# Patient Record
Sex: Female | Born: 1960 | Race: Black or African American | Hispanic: No | Marital: Single | Smoking: Never smoker
Health system: Southern US, Community
[De-identification: ages and names within clinical notes are randomized; demographics above are authoritative.]

## PROBLEM LIST (undated history)

## (undated) DIAGNOSIS — I10 Essential (primary) hypertension: Secondary | ICD-10-CM

## (undated) HISTORY — PX: ABDOMINAL SURGERY: SHX537

---

## 2014-12-31 ENCOUNTER — Emergency Department (HOSPITAL_COMMUNITY): Payer: Medicaid - Out of State

## 2014-12-31 ENCOUNTER — Emergency Department (HOSPITAL_COMMUNITY)
Admission: EM | Admit: 2014-12-31 | Discharge: 2014-12-31 | Disposition: A | Discharge status: Home / Self Care | Payer: Medicaid - Out of State | Attending: Emergency Medicine | Admitting: Emergency Medicine

## 2014-12-31 ENCOUNTER — Encounter (HOSPITAL_COMMUNITY): Payer: Self-pay | Admitting: *Deleted

## 2014-12-31 DIAGNOSIS — Z3202 Encounter for pregnancy test, result negative: Secondary | ICD-10-CM | POA: Diagnosis not present

## 2014-12-31 DIAGNOSIS — R109 Unspecified abdominal pain: Secondary | ICD-10-CM | POA: Diagnosis not present

## 2014-12-31 DIAGNOSIS — M545 Low back pain: Secondary | ICD-10-CM | POA: Diagnosis not present

## 2014-12-31 DIAGNOSIS — M549 Dorsalgia, unspecified: Secondary | ICD-10-CM

## 2014-12-31 DIAGNOSIS — R112 Nausea with vomiting, unspecified: Secondary | ICD-10-CM | POA: Diagnosis not present

## 2014-12-31 DIAGNOSIS — I10 Essential (primary) hypertension: Secondary | ICD-10-CM | POA: Insufficient documentation

## 2014-12-31 DIAGNOSIS — R079 Chest pain, unspecified: Secondary | ICD-10-CM | POA: Diagnosis present

## 2014-12-31 HISTORY — DX: Essential (primary) hypertension: I10

## 2014-12-31 LAB — URINALYSIS, ROUTINE W REFLEX MICROSCOPIC
Bilirubin Urine: NEGATIVE
Glucose, UA: NEGATIVE mg/dL
Hgb urine dipstick: NEGATIVE
Ketones, ur: NEGATIVE mg/dL
Leukocytes, UA: NEGATIVE
NITRITE: NEGATIVE
Protein, ur: NEGATIVE mg/dL
SPECIFIC GRAVITY, URINE: 1.014 (ref 1.005–1.030)
UROBILINOGEN UA: 0.2 mg/dL (ref 0.0–1.0)
pH: 7.5 (ref 5.0–8.0)

## 2014-12-31 LAB — BASIC METABOLIC PANEL
Anion gap: 4 — ABNORMAL LOW (ref 5–15)
BUN: 9 mg/dL (ref 6–20)
CO2: 27 mmol/L (ref 22–32)
Calcium: 10.6 mg/dL — ABNORMAL HIGH (ref 8.9–10.3)
Chloride: 107 mmol/L (ref 101–111)
Creatinine, Ser: 0.71 mg/dL (ref 0.44–1.00)
GFR calc Af Amer: >60 mL/min (ref >60)
GFR calc non Af Amer: >60 mL/min (ref >60)
Glucose, Bld: 107 mg/dL — ABNORMAL HIGH (ref 65–99)
Potassium: 3.7 mmol/L (ref 3.5–5.1)
Sodium: 138 mmol/L (ref 135–145)

## 2014-12-31 LAB — CBC
HCT: 37.9 % (ref 36.0–46.0)
Hemoglobin: 12.5 g/dL (ref 12.0–15.0)
MCH: 26.8 pg (ref 26.0–34.0)
MCHC: 33 g/dL (ref 30.0–36.0)
MCV: 81.2 fL (ref 78.0–100.0)
Platelets: 271 10*3/uL (ref 150–400)
RBC: 4.67 MIL/uL (ref 3.87–5.11)
RDW: 15 % (ref 11.5–15.5)
WBC: 6.6 10*3/uL (ref 4.0–10.5)

## 2014-12-31 LAB — I-STAT TROPONIN, ED: Troponin i, poc: 0 ng/mL (ref 0.00–0.08)

## 2014-12-31 LAB — POC URINE PREG, ED: PREG TEST UR: NEGATIVE

## 2014-12-31 MED ORDER — OXYCODONE HCL 5 MG PO TABS: 5.0000 mg | ORAL_TABLET | ORAL | Status: AC | PRN

## 2014-12-31 MED ORDER — GADOBENATE DIMEGLUMINE 529 MG/ML IV SOLN
20.0000 mL | Freq: Once | INTRAVENOUS | Status: AC | PRN
  Administered 2014-12-31: 20 mL via INTRAVENOUS

## 2014-12-31 MED ORDER — IOHEXOL 300 MG/ML  SOLN
25.0000 mL | Freq: Once | INTRAMUSCULAR | Status: AC | PRN
  Administered 2014-12-31: 25 mL via ORAL

## 2014-12-31 MED ORDER — IOHEXOL 300 MG/ML  SOLN
100.0000 mL | Freq: Once | INTRAMUSCULAR | Status: AC | PRN
  Administered 2014-12-31: 100 mL via INTRAVENOUS

## 2014-12-31 NOTE — ED Notes (Signed)
Pt ambulated to the bathroom with ease 

## 2014-12-31 NOTE — ED Notes (Signed)
MD at bedside. 

## 2014-12-31 NOTE — ED Notes (Signed)
Pt states that she woke from sleep with central chest pain that is intermittent. Pt reports dizziness and nausea.

## 2014-12-31 NOTE — ED Provider Notes (Signed)
CSN: [id number]     Arrival date & time 12/31/14  1356 History   First MD Initiated Contact with Patient 12/31/14 1612     Chief Complaint  Patient presents with  . Chest Pain   Angela Foley is a 54 y.o. female with a past medical history significant for hypertension, congenital blindness, prior uterine surgery, and a reported history of hypercalcemia who presents with right flank pain, nausea, vomiting, abdominal pain, and chest pain. The patient reports that she was feeling normal yesterday however, this morning she began having right flank and right low back pain. The patient describes the pain radiating around towards her abdomen. The patient says she's had approximately 45 episodes of nonbloody nonbilious emesis. The patient also described associated light chest pressure. The patient described pain nonradiating, a 2 out of 10 in severity, and constant. The patient also felt it felt "like gas pain or aching from vomiting". The patient says she's never felt this pain before. The patient also describes 1 week of discolored cloudy urine. The patient denied a foul smell, dysuria, frequency or hesitancy. The patient denies a personal history of kidney stone however the patient's mother reports a family history of cholelithiasis. The patient denies any associated diaphoresis, fevers, chills, constipation, diarrhea and the patient had a normal bowel movement earlier today.  (Consider location/radiation/quality/duration/timing/severity/associated sxs/prior Treatment) Patient is a 54 y.o. female presenting with flank pain. The history is provided by the patient and a parent. No language interpreter was used.  Flank Pain This is a new problem. The current episode started today. The problem occurs constantly. The problem has been unchanged. Associated symptoms include abdominal pain, chest pain, nausea, urinary symptoms (cloudy urine, no pain) and vomiting. Pertinent negatives include no chills, congestion,  coughing, diaphoresis, fatigue, fever, neck pain, numbness or rash. Nothing aggravates the symptoms. She has tried nothing for the symptoms. The treatment provided no relief.    Past Medical History  Diagnosis Date  . Hypertension    Past Surgical History  Procedure Laterality Date  . Abdominal surgery     No family history on file. Social History  Substance Use Topics  . Smoking status: Never Smoker   . Smokeless tobacco: None  . Alcohol Use: None   OB History    No data available     Review of Systems  Constitutional: Negative for fever, chills, diaphoresis, appetite change and fatigue.  HENT: Negative for congestion.   Eyes: Negative for visual disturbance.  Respiratory: Negative for cough, choking, chest tightness, shortness of breath, wheezing and stridor.   Cardiovascular: Positive for chest pain. Negative for palpitations and leg swelling.  Gastrointestinal: Positive for nausea, vomiting and abdominal pain. Negative for diarrhea, constipation and blood in stool.  Genitourinary: Positive for flank pain.  Musculoskeletal: Positive for back pain. Negative for neck pain and neck stiffness.  Skin: Negative for rash and wound.  Neurological: Negative for numbness.  Psychiatric/Behavioral: Negative for confusion and agitation.  All other systems reviewed and are negative.     Allergies  Review of patient's allergies indicates no known allergies.  Home Medications   Prior to Admission medications   Not on File   BP 144/104 mmHg  Pulse 75  Temp(Src) 98.6 F (37 C) (Oral)  Resp 18  SpO2 99% Physical Exam  Constitutional: She is oriented to person, place, and time. She appears well-developed and well-nourished. No distress.  HENT:  Head: Normocephalic.  Mouth/Throat: Oropharynx is clear and moist. No oropharyngeal exudate.  Eyes: Conjunctivae  are normal. Pupils are equal, round, and reactive to light. Right eye exhibits nystagmus. Left eye exhibits nystagmus.   Neck: Normal range of motion.  Cardiovascular: Normal rate, regular rhythm, normal heart sounds and intact distal pulses.   No murmur heard. Pulmonary/Chest: Effort normal and breath sounds normal. No stridor. No respiratory distress. She has no wheezes. She exhibits no tenderness.  Abdominal: Soft. Bowel sounds are normal. She exhibits no distension. There is no tenderness. There is no rigidity, no rebound, no guarding and no CVA tenderness.  Musculoskeletal: She exhibits no tenderness.  Neurological: She is alert and oriented to person, place, and time. She exhibits normal muscle tone.  Skin: Skin is warm. She is not diaphoretic. No pallor.  Psychiatric: She has a normal mood and affect.  Nursing note and vitals reviewed.   ED Course  Procedures (including critical care time) Labs Review Labs Reviewed  BASIC METABOLIC PANEL - Abnormal; Notable for the following:    Glucose, Bld 107 (*)    Calcium 10.6 (*)    Anion gap 4 (*)    All other components within normal limits  URINE CULTURE  CBC  URINALYSIS, ROUTINE W REFLEX MICROSCOPIC (NOT AT [facility])  Rosezena Sensor, ED  POC URINE PREG, ED    Imaging Review Dg Chest 2 View  12/31/2014   CLINICAL DATA:  Chest pain beginning today.  EXAM: CHEST  2 VIEW  COMPARISON:  None.  FINDINGS: Cardiac enlargement. No infiltrates or failure. No effusion or pneumothorax. Bones unremarkable.  IMPRESSION: Cardiomegaly.  No active cardiopulmonary disease.   Electronically Signed   By: Elsie Stain M.D.   On: 12/31/2014 15:54   Mr Lumbar Spine W Wo Contrast  12/31/2014   CLINICAL DATA:  Non radiating back pain beginning yesterday morning. Intermittent central chest pain. Dizziness and nausea. History of hypertension.  EXAM: MRI LUMBAR SPINE WITHOUT AND WITH CONTRAST  TECHNIQUE: Multiplanar and multiecho pulse sequences of the lumbar spine were obtained without and with intravenous contrast.  CONTRAST:  20mL MULTIHANCE GADOBENATE DIMEGLUMINE 529 MG/ML IV  SOLN  COMPARISON:  CT abdomen and pelvis October 30, 2014 at 1854 hours  FINDINGS: Using the reference level of the last well-formed intervertebral disc as L5-S1, bright T1 and bright T2, intermediate to low STIR 15 mm polka-dot lesion in vertebral body L2 corresponding to CT abnormality with faint enhancement. This is most compatible with a hemangioma. Additional subcentimeter hemangioma superior endplate of S1. No suspicious osseous enhancement. Vertebral bodies intact and aligned and maintenance of lumbar lordosis. Slightly decreased T2 signal within the lower lumbar disc consistent with mild desiccation. Mild subacute discogenic endplate changes L3-4 through L5-S1. No STIR signal abnormality to suggest acute osseous process.  Conus medullaris terminates at L1 and appears normal in morphology and signal characteristics. No abnormal cord, leptomeningeal or epidural enhancement. Included prevertebral and paraspinal soft tissues are nonsuspicious; multiple signal lesions in the uterus compatible with leiomyomas, partially imaged.  Level by level evaluation:  L1-2, L2-3: No significant disc bulge, canal stenosis or neural foraminal narrowing.  L3-4: Minimal annular bulging. Mild facet arthropathy and ligamentum flavum redundancy without canal stenosis or neural foraminal narrowing.  L4-5: Minimal annular bulging, mild facet arthropathy and ligamentum flavum redundancy without canal stenosis. Minimal neural foraminal narrowing.  L5-S1: Small broad-based disc bulge, central enhancing annular fissure. Moderate bilateral facet arthropathy. No canal stenosis though there is encroachment upon the traversing S1 nerves within the lateral recess. Mild to moderate LEFT greater than RIGHT neural foraminal narrowing.  IMPRESSION: L2 hemangioma.  No suspicious lesions in the lumbar spine.  No acute lumbar spine fracture or malalignment.  Degenerative change of the lumbar spine without canal stenosis. Mild to moderate L5-S1 neural  foraminal narrowing.  L5-S1 annular fissure.   Electronically Signed   By: Awilda Metro M.D.   On: 12/31/2014 22:09   Ct Abdomen Pelvis W Contrast  12/31/2014   CLINICAL DATA:  Right flank and abdominal pain for 1 day. Nausea and vomiting. History of fibroid surgery years ago.  EXAM: CT ABDOMEN AND PELVIS WITH CONTRAST  TECHNIQUE: Multidetector CT imaging of the abdomen and pelvis was performed using the standard protocol following bolus administration of intravenous contrast.  CONTRAST:  OMNIPAQUE IOHEXOL 300 MG/ML  SOLN  COMPARISON:  None.  FINDINGS: Lower chest: There are a few small thin-walled air cysts scattered at both lung bases, which are nonspecific. No significant pulmonary nodules or acute consolidative airspace disease at the lung bases.  Hepatobiliary: Normal liver with no liver mass. Normal gallbladder with no radiopaque cholelithiasis. No biliary ductal dilatation.  Pancreas: Normal, with no mass or duct dilation.  Spleen: Normal size. No mass.  Adrenals/Urinary Tract: Normal adrenals. Simple 1.0 cm renal cyst in the left upper kidney. Subcentimeter hypodense lesion in the lateral lower left kidney, too small to characterize. No hydronephrosis. Normal bladder.  Stomach/Bowel: Grossly normal stomach. Normal caliber small bowel with no small bowel wall thickening. Normal appendix. Normal large bowel with no diverticulosis, large bowel wall thickening or pericolonic fat stranding.  Vascular/Lymphatic: Mildly atherosclerotic nonaneurysmal abdominal aorta. Patent portal, splenic, hepatic and renal veins. No pathologically enlarged lymph nodes in the abdomen or pelvis.  Reproductive: The anteverted uterus is mildly enlarged with a few hypodense uterine masses measuring up to at 2.9 cm, at least 1 of which contains coarse calcifications, most in keeping with uterine fibroids. No adnexal mass.  Other: No pneumoperitoneum, ascites or focal fluid collection.  Musculoskeletal: There is a  nonspecific 2.0 cm sclerotic lesion in the right posterior L2 vertebral body. Moderate degenerative changes in the visualized thoracolumbar spine.  IMPRESSION: 1. No CT findings to explain the patient's acute right-sided symptoms. Normal appendix. No evidence of bowel obstruction or acute bowel inflammation. No hydronephrosis. 2. Solitary 2 cm L2 vertebral body sclerotic lesion, nonspecific, cannot exclude a sclerotic metastasis. Consider further evaluation with lumbar spine MRI with and without intravenous contrast. 3. Mildly enlarged uterus containing several round masses most suggestive of fibroids. Consider correlation with pelvic ultrasound if not recently performed. 4. Nonspecific small thin-walled cysts at both lung bases, likely not clinically significant. Rarely, this can represent lymphoid interstitial pneumonia (LIP), especially if the patient has a history of Sjogren disease.   Electronically Signed   By: Delbert Phenix M.D.   On: 12/31/2014 19:23   I have personally reviewed and evaluated these images and lab results as part of my medical decision-making.   EKG Interpretation   Date/Time:  Wednesday December 31 2014 14:03:53 EDT Ventricular Rate:  73 PR Interval:  162 QRS Duration: 74 QT Interval:  404 QTC Calculation: 445 R Axis:   -12 Text Interpretation:  Normal sinus rhythm Possible Anterior infarct , age  undetermined Abnormal ECG No old tracing to compare Confirmed by Juleen China   MD, STEPHEN [phone]) on 12/31/2014 4:17:01 PM      MDM   Angela Foley is a 54 y.o. female with a past medical history significant for hypertension, congenital blindness, prior uterine surgery, and a reported history of hypercalcemia  who presents with right flank pain, nausea, vomiting, abdominal pain, and chest pain. Based on the patient's complaint of flank/abdominal pain, given her history there is differential diagnosis list including nephrolithiasis, cholelithiasis, musculoskeletal pain, hypercalcemia  pain, gastritis, pyelonephritis, abdominal mass, and urinary tract infection. The patient's chest pain is felt likely related to her nausea and vomiting however the patient will also have a chest x-ray, EKG, and troponin to investigate. The patient will have laboratory testing, urine testing, and CT scan to determine the etiology of her symptoms. The patient's pain is mild on initial examination and the patient did not request any pain medicine or nausea medicine on arrival.   The patient's initial troponin was negative, her BMP did not show any acute abnormalities, and her CBC was unremarkable. The patient's urine did not show evidence of infection or findings that would suggest  stone.  7:57 PM The patient's CT scan returned showing concern for possible lumbar spine metastasis versus other bony abnormality. Given the patient's similar location of her back pain to this abnormality, and the recommendation by radiology for further imaging, the patient will have a lumbar spine MRI with and without contrast further evaluate.  The patient's MRI did not show evidence of metastatic disease and instead showed evidence of a hemangioma in her lumbar spine. The patient was informed of these findings and reassured.    The patient reported resolution of her presenting flank back and abdominal pain. Suspect a musculoskeletal etiology with a negative urinalysis for infection or blood, negative CT scan for nephrolithiasis or cholelithiasis. The patient reports that she is going to [place] this weekend and heading back to her home city of [place] next week where she will follow-up with her PCP. The patient was given several doses of a pain medication in order to alleviate the pain. The patient was given return precautions for any new or worsening symptoms including loss of bowel or bladder function, difficulty with strength sensation or coordination of her legs, and any other changes.  The patient and her family was  understanding of the plan of care, they had no further questions, concerns, or complaints and the patient was discharged in good condition.  This patient was seen with Dr. Juleen China, emergency medicine attending.   Final diagnoses:  Back pain      Theda Belfast, MD 01/01/15 1610  Raeford Razor, MD 01/08/15 1531

## 2015-01-01 LAB — URINE CULTURE

## 2016-11-23 IMAGING — MR MR LUMBAR SPINE WO/W CM
4 of 7 series · 18 of 48 positions shown · IV contrast (multihance)
Comparison: CT abdomen and pelvis October 30, 2014 at 7071 hours

CLINICAL DATA: Non radiating back pain beginning yesterday morning.
Intermittent central chest pain. Dizziness and nausea. History of
hypertension.

EXAM:
MRI LUMBAR SPINE WITHOUT AND WITH CONTRAST
TECHNIQUE: Multiplanar and multiecho pulse sequences of the lumbar spine were
obtained without and with intravenous contrast.
CONTRAST:  20mL MULTIHANCE GADOBENATE DIMEGLUMINE 529 MG/ML IV SOLN

[Series 200: T2 · sagittal · 4.0mm · 0.55mm/px · 5 of 13 slices shown (1 of 2)]
[im 1/13]
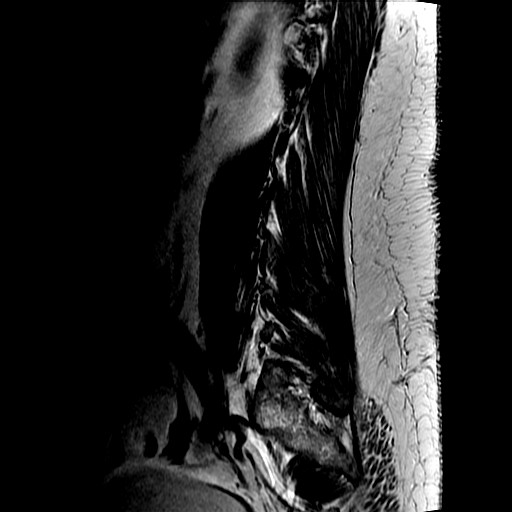
[im 4/13]
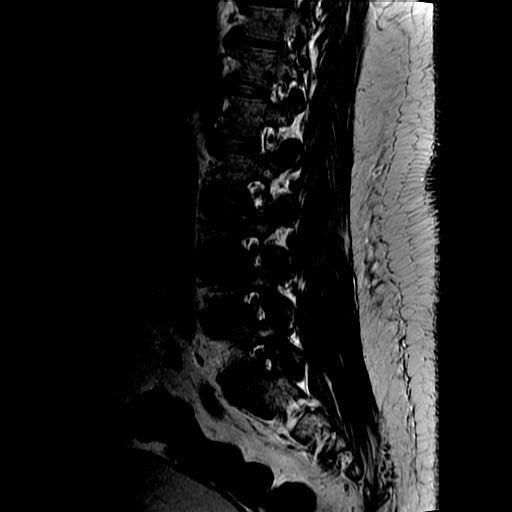
[im 7/13]
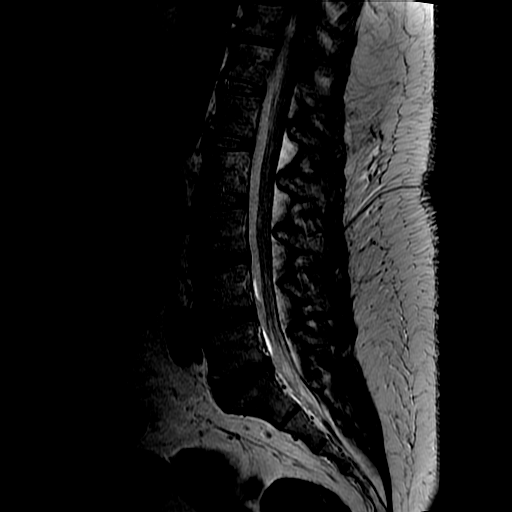
[im 10/13]
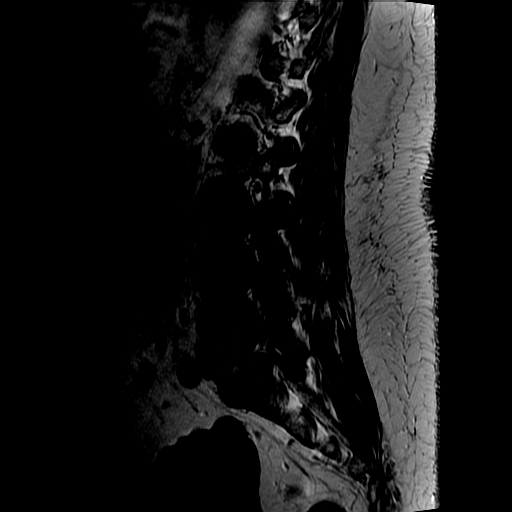
[im 13/13]
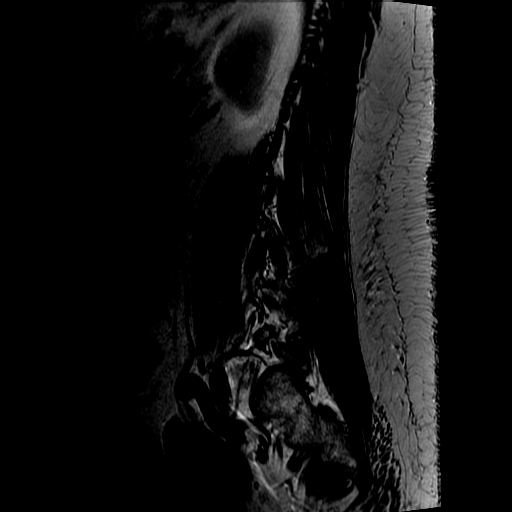

[Series 400: T1 · sagittal · 4.0mm · 0.55mm/px · 3 of 13 slices shown (1 of 2)]
[im 1/13]
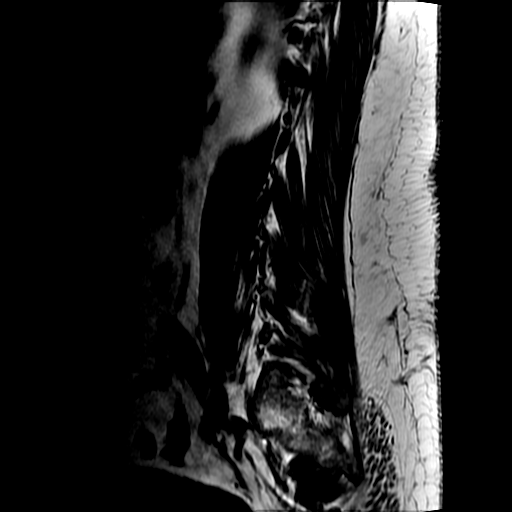
[im 9/13]
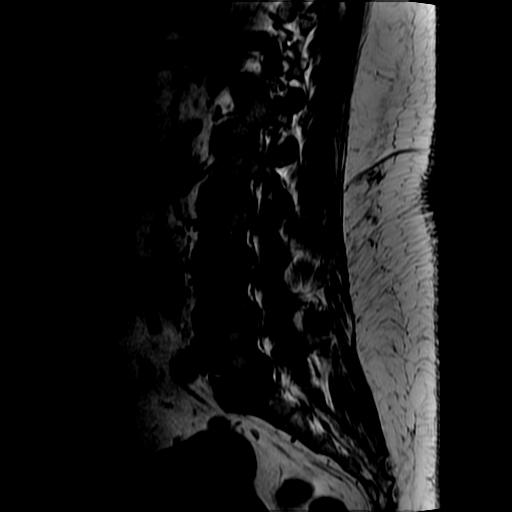
[im 13/13]
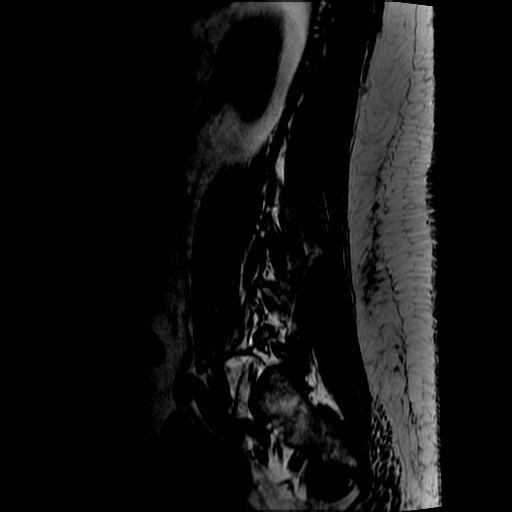

[Series 500: T2 · axial · 4.0mm · 0.39mm/px · z∈[-59,+110]mm · 7 of 33 slices shown (2 of 2)]
[im 1/33]
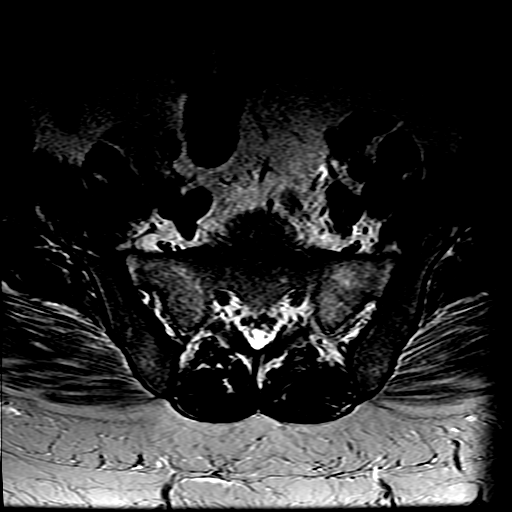
[im 4/33]
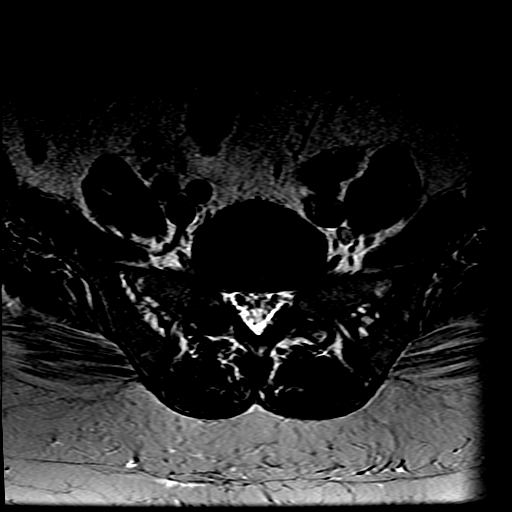
[im 11/33]
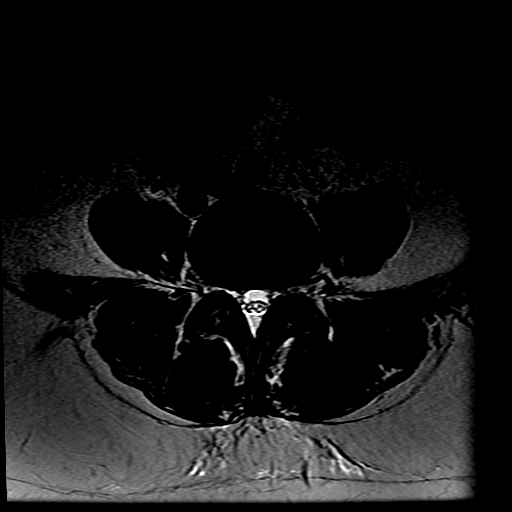
[im 15/33]
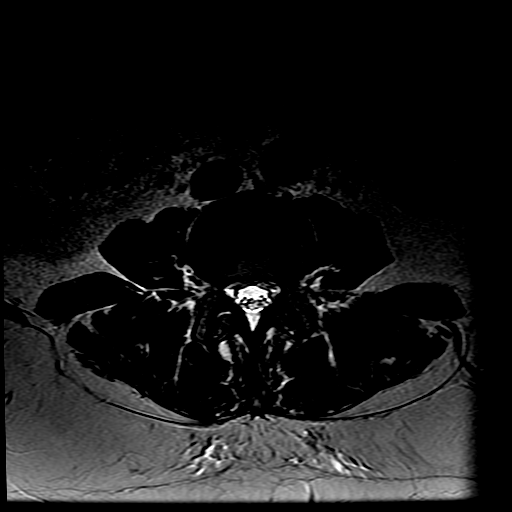
[im 18/33]
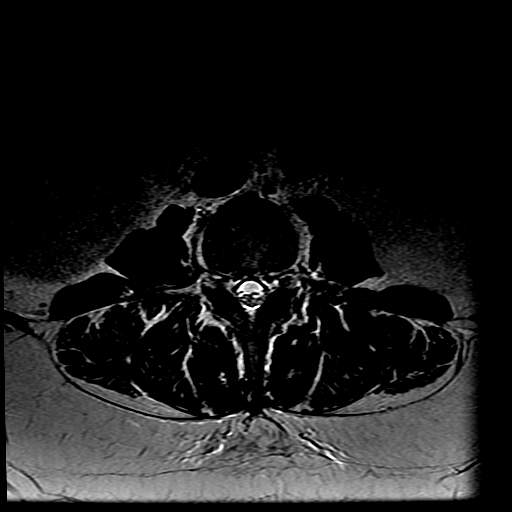
[im 22/33]
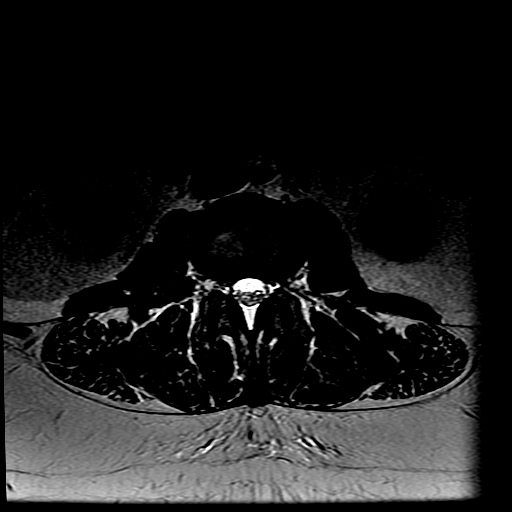
[im 29/33]
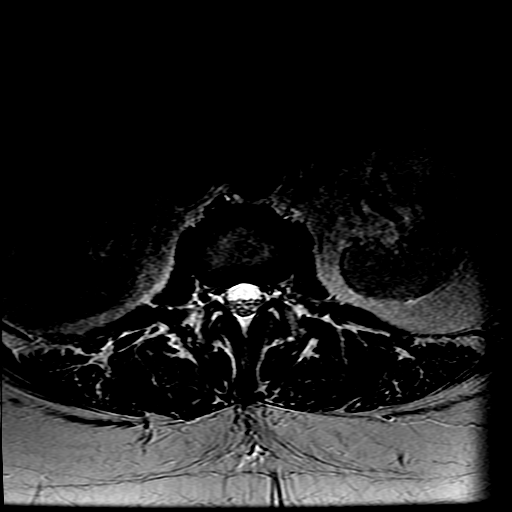

[Series 600: T1 · axial · 4.0mm · 0.39mm/px · z∈[-44,+110]mm · 3 of 33 slices shown (2 of 2)]
[im 4/33]
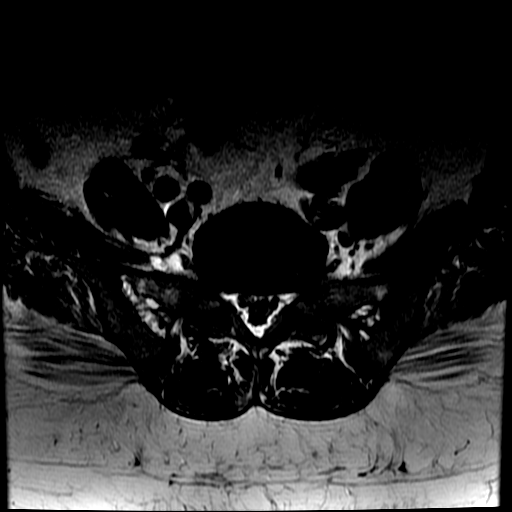
[im 18/33]
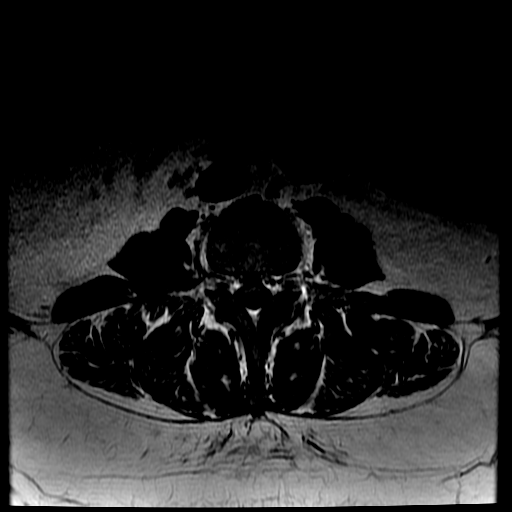
[im 29/33]
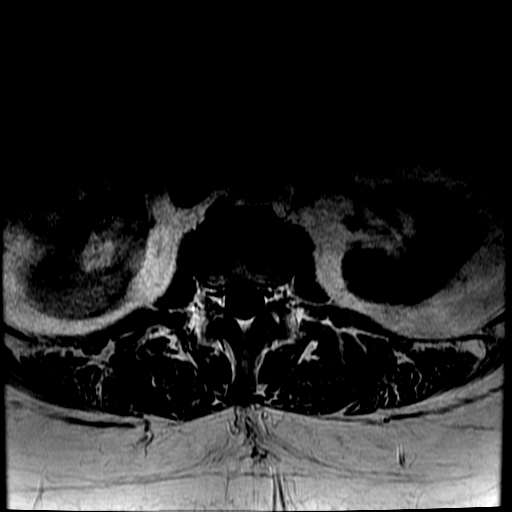

[18 of 48 positions shown; findings below may reference images not displayed]

FINDINGS: Using the reference level of the last well-formed intervertebral
disc as L5-S1, bright T1 and bright T2, intermediate to low STIR 15
mm Dron lesion in vertebral body L2 corresponding to CT
abnormality with faint enhancement. This is most compatible with a
hemangioma. Additional subcentimeter hemangioma superior endplate of
S1. No suspicious osseous enhancement. Vertebral bodies intact and
aligned and maintenance of lumbar lordosis. Slightly decreased T2
signal within the lower lumbar disc consistent with mild
desiccation. Mild subacute discogenic endplate changes L3-4 through
L5-S1. No STIR signal abnormality to suggest acute osseous process.

Conus medullaris terminates at L1 and appears normal in morphology
and signal characteristics. No abnormal cord, leptomeningeal or
epidural enhancement. Included prevertebral and paraspinal soft
tissues are nonsuspicious; multiple signal lesions in the uterus
compatible with leiomyomas, partially imaged.

Level by level evaluation:

L1-2, L2-3: No significant disc bulge, canal stenosis or neural
foraminal narrowing.

L3-4: Minimal annular bulging. Mild facet arthropathy and ligamentum
flavum redundancy without canal stenosis or neural foraminal
narrowing.

L4-5: Minimal annular bulging, mild facet arthropathy and ligamentum
flavum redundancy without canal stenosis. Minimal neural foraminal
narrowing.

L5-S1: Small broad-based disc bulge, central enhancing annular
fissure. Moderate bilateral facet arthropathy. No canal stenosis
though there is encroachment upon the traversing S1 nerves within
the lateral recess. Mild to moderate LEFT greater than RIGHT neural
foraminal narrowing.
IMPRESSION: L2 hemangioma.  No suspicious lesions in the lumbar spine.

No acute lumbar spine fracture or malalignment.

Degenerative change of the lumbar spine without canal stenosis. Mild
to moderate L5-S1 neural foraminal narrowing.

L5-S1 annular fissure.
